# Patient Record
Sex: Male | Born: 1980 | Race: Black or African American | Hispanic: No | Marital: Single | Smoking: Heavy tobacco smoker
Health system: Southern US, Community
[De-identification: ages and names within clinical notes are randomized; demographics above are authoritative.]

---

## 2000-12-26 ENCOUNTER — Emergency Department (HOSPITAL_COMMUNITY): Admission: EM | Admit: 2000-12-26 | Discharge: 2000-12-26 | Payer: Self-pay | Admitting: Emergency Medicine

## 2005-12-12 ENCOUNTER — Emergency Department (HOSPITAL_COMMUNITY): Admission: EM | Admit: 2005-12-12 | Discharge: 2005-12-12 | Payer: Self-pay | Admitting: Emergency Medicine

## 2007-10-27 ENCOUNTER — Inpatient Hospital Stay (HOSPITAL_COMMUNITY): Admission: AC | Admit: 2007-10-27 | Discharge: 2007-10-29 | Payer: Self-pay

## 2007-10-31 ENCOUNTER — Emergency Department (HOSPITAL_COMMUNITY): Admission: EM | Admit: 2007-10-31 | Discharge: 2007-10-31 | Payer: Self-pay | Admitting: Emergency Medicine

## 2007-12-02 ENCOUNTER — Emergency Department (HOSPITAL_COMMUNITY): Admission: EM | Admit: 2007-12-02 | Discharge: 2007-12-02 | Payer: Self-pay | Admitting: Family Medicine

## 2009-01-25 ENCOUNTER — Emergency Department (HOSPITAL_COMMUNITY): Admission: EM | Admit: 2009-01-25 | Discharge: 2009-01-25 | Payer: Self-pay | Admitting: Emergency Medicine

## 2009-02-22 ENCOUNTER — Emergency Department (HOSPITAL_COMMUNITY): Admission: EM | Admit: 2009-02-22 | Discharge: 2009-02-22 | Payer: Self-pay | Admitting: Emergency Medicine
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
## 2010-04-03 ENCOUNTER — Encounter: Payer: Self-pay | Admitting: *Deleted
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
## 2012-05-14 ENCOUNTER — Emergency Department (HOSPITAL_COMMUNITY)
Admission: EM | Admit: 2012-05-14 | Discharge: 2012-05-14 | Disposition: A | Discharge status: Home / Self Care | Payer: No Typology Code available for payment source | Attending: Emergency Medicine | Admitting: Emergency Medicine

## 2012-05-14 ENCOUNTER — Encounter (HOSPITAL_COMMUNITY): Payer: Self-pay

## 2012-05-14 ENCOUNTER — Emergency Department (HOSPITAL_COMMUNITY): Payer: No Typology Code available for payment source

## 2012-05-14 DIAGNOSIS — Y9389 Activity, other specified: Secondary | ICD-10-CM | POA: Insufficient documentation

## 2012-05-14 DIAGNOSIS — S0993XA Unspecified injury of face, initial encounter: Secondary | ICD-10-CM | POA: Insufficient documentation

## 2012-05-14 DIAGNOSIS — T148XXA Other injury of unspecified body region, initial encounter: Secondary | ICD-10-CM

## 2012-05-14 DIAGNOSIS — S139XXA Sprain of joints and ligaments of unspecified parts of neck, initial encounter: Secondary | ICD-10-CM | POA: Insufficient documentation

## 2012-05-14 DIAGNOSIS — F17200 Nicotine dependence, unspecified, uncomplicated: Secondary | ICD-10-CM | POA: Insufficient documentation

## 2012-05-14 DIAGNOSIS — Y92410 Unspecified street and highway as the place of occurrence of the external cause: Secondary | ICD-10-CM | POA: Insufficient documentation

## 2012-05-14 DIAGNOSIS — S199XXA Unspecified injury of neck, initial encounter: Secondary | ICD-10-CM | POA: Insufficient documentation

## 2012-05-14 DIAGNOSIS — IMO0002 Reserved for concepts with insufficient information to code with codable children: Secondary | ICD-10-CM | POA: Insufficient documentation

## 2012-05-14 DIAGNOSIS — S8990XA Unspecified injury of unspecified lower leg, initial encounter: Secondary | ICD-10-CM | POA: Insufficient documentation

## 2012-05-14 MED ORDER — IBUPROFEN 400 MG PO TABS
800.0000 mg | ORAL_TABLET | Freq: Once | ORAL | Status: AC
  Administered 2012-05-14: 800 mg via ORAL
  Filled 2012-05-14: qty 2

## 2012-05-14 MED ORDER — CYCLOBENZAPRINE HCL 10 MG PO TABS: 10.0000 mg | ORAL_TABLET | Freq: Two times a day (BID) | ORAL | Status: DC | PRN

## 2012-05-14 MED ORDER — HYDROCODONE-ACETAMINOPHEN 5-325 MG PO TABS: 1.0000 | ORAL_TABLET | Freq: Four times a day (QID) | ORAL | Status: DC | PRN

## 2012-05-14 NOTE — ED Notes (Signed)
Patient transported to X-ray 
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
## 2012-05-15 NOTE — ED Provider Notes (Signed)
Medical screening examination/treatment/procedure(s) were performed by non-physician practitioner and as supervising physician I was immediately available for consultation/collaboration.   Lyanne Co, MD 05/15/12 [phone]

## 2015-07-10 ENCOUNTER — Emergency Department (HOSPITAL_COMMUNITY)
Admission: EM | Admit: 2015-07-10 | Discharge: 2015-07-10 | Disposition: A | Discharge status: Home / Self Care | Payer: No Typology Code available for payment source | Attending: Emergency Medicine | Admitting: Emergency Medicine

## 2015-07-10 ENCOUNTER — Encounter (HOSPITAL_COMMUNITY): Payer: Self-pay | Admitting: *Deleted

## 2015-07-10 DIAGNOSIS — F17210 Nicotine dependence, cigarettes, uncomplicated: Secondary | ICD-10-CM | POA: Insufficient documentation

## 2015-07-10 DIAGNOSIS — R55 Syncope and collapse: Secondary | ICD-10-CM | POA: Insufficient documentation

## 2015-07-10 NOTE — ED Notes (Signed)
Pt requesting work note and taxi called for pt
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
## 2015-07-10 NOTE — Discharge Instructions (Signed)

## 2016-06-22 ENCOUNTER — Emergency Department (HOSPITAL_COMMUNITY): Payer: Self-pay

## 2016-06-22 ENCOUNTER — Emergency Department (HOSPITAL_COMMUNITY)
Admission: EM | Admit: 2016-06-22 | Discharge: 2016-06-22 | Disposition: A | Discharge status: Home / Self Care | Payer: Self-pay | Attending: Emergency Medicine | Admitting: Emergency Medicine

## 2016-06-22 ENCOUNTER — Encounter (HOSPITAL_COMMUNITY): Payer: Self-pay | Admitting: Emergency Medicine

## 2016-06-22 DIAGNOSIS — S71101A Unspecified open wound, right thigh, initial encounter: Secondary | ICD-10-CM | POA: Insufficient documentation

## 2016-06-22 DIAGNOSIS — Y929 Unspecified place or not applicable: Secondary | ICD-10-CM | POA: Insufficient documentation

## 2016-06-22 DIAGNOSIS — Y999 Unspecified external cause status: Secondary | ICD-10-CM | POA: Insufficient documentation

## 2016-06-22 DIAGNOSIS — Y939 Activity, unspecified: Secondary | ICD-10-CM | POA: Insufficient documentation

## 2016-06-22 DIAGNOSIS — F17210 Nicotine dependence, cigarettes, uncomplicated: Secondary | ICD-10-CM | POA: Insufficient documentation

## 2016-06-22 DIAGNOSIS — W3400XA Accidental discharge from unspecified firearms or gun, initial encounter: Secondary | ICD-10-CM | POA: Insufficient documentation

## 2016-06-22 LAB — CBC WITH DIFFERENTIAL/PLATELET
BASOS ABS: 0 10*3/uL (ref 0.0–0.1)
Basophils Relative: 0 %
EOS ABS: 0.3 10*3/uL (ref 0.0–0.7)
EOS PCT: 4 %
HCT: 40.1 % (ref 39.0–52.0)
HEMOGLOBIN: 13.3 g/dL (ref 13.0–17.0)
LYMPHS PCT: 37 %
Lymphs Abs: 2.6 10*3/uL (ref 0.7–4.0)
MCH: 30.1 pg (ref 26.0–34.0)
MCHC: 33.2 g/dL (ref 30.0–36.0)
MCV: 90.7 fL (ref 78.0–100.0)
Monocytes Absolute: 0.5 10*3/uL (ref 0.1–1.0)
Monocytes Relative: 7 %
NEUTROS PCT: 52 %
Neutro Abs: 3.6 10*3/uL (ref 1.7–7.7)
PLATELETS: 284 10*3/uL (ref 150–400)
RBC: 4.42 MIL/uL (ref 4.22–5.81)
RDW: 14.2 % (ref 11.5–15.5)
WBC: 7.1 10*3/uL (ref 4.0–10.5)

## 2016-06-22 LAB — PREPARE FRESH FROZEN PLASMA
UNIT DIVISION: 0
Unit division: 0

## 2016-06-22 LAB — TYPE AND SCREEN
ABO/RH(D): B POS
ANTIBODY SCREEN: NEGATIVE
UNIT DIVISION: 0
Unit division: 0

## 2016-06-22 LAB — BPAM FFP
Blood Product Expiration Date: 201804162359
Blood Product Expiration Date: 201804162359
ISSUE DATE / TIME: 201804120059
ISSUE DATE / TIME: 201804120059
UNIT TYPE AND RH: 600
Unit Type and Rh: 6200

## 2016-06-22 LAB — BASIC METABOLIC PANEL
Anion gap: 7 (ref 5–15)
BUN: 6 mg/dL (ref 6–20)
CHLORIDE: 108 mmol/L (ref 101–111)
CO2: 25 mmol/L (ref 22–32)
Calcium: 9.2 mg/dL (ref 8.9–10.3)
Creatinine, Ser: 0.8 mg/dL (ref 0.61–1.24)
GFR calc non Af Amer: >60 mL/min (ref >60)
Glucose, Bld: 112 mg/dL — ABNORMAL HIGH (ref 65–99)
POTASSIUM: 3.4 mmol/L — AB (ref 3.5–5.1)
SODIUM: 140 mmol/L (ref 135–145)

## 2016-06-22 LAB — I-STAT CHEM 8, ED
BUN: 6 mg/dL (ref 6–20)
CHLORIDE: 107 mmol/L (ref 101–111)
Calcium, Ion: 1.11 mmol/L — ABNORMAL LOW (ref 1.15–1.40)
Creatinine, Ser: 1 mg/dL (ref 0.61–1.24)
Glucose, Bld: 111 mg/dL — ABNORMAL HIGH (ref 65–99)
HCT: 42 % (ref 39.0–52.0)
Hemoglobin: 14.3 g/dL (ref 13.0–17.0)
POTASSIUM: 3.4 mmol/L — AB (ref 3.5–5.1)
SODIUM: 142 mmol/L (ref 135–145)
TCO2: 25 mmol/L (ref 0–100)

## 2016-06-22 LAB — BPAM RBC
BLOOD PRODUCT EXPIRATION DATE: 201805072359
BLOOD PRODUCT EXPIRATION DATE: 201805112359
ISSUE DATE / TIME: 201804120058
ISSUE DATE / TIME: 201804120058
UNIT TYPE AND RH: 9500
Unit Type and Rh: 9500

## 2016-06-22 LAB — ABO/RH: ABO/RH(D): B POS

## 2016-06-22 MED ORDER — SODIUM CHLORIDE 0.9 % IV SOLN
INTRAVENOUS | Status: AC | PRN
  Administered 2016-06-22: 125 mL/h via INTRAVENOUS

## 2016-06-22 MED ORDER — HYDROCODONE-ACETAMINOPHEN 5-325 MG PO TABS: 1.0000 | ORAL_TABLET | ORAL | 0 refills | Status: DC | PRN

## 2016-06-22 MED ORDER — FENTANYL CITRATE (PF) 100 MCG/2ML IJ SOLN
INTRAMUSCULAR | Status: AC
  Filled 2016-06-22: qty 2

## 2016-06-22 MED ORDER — FENTANYL CITRATE (PF) 100 MCG/2ML IJ SOLN
50.0000 ug | Freq: Once | INTRAMUSCULAR | Status: AC
  Administered 2016-06-22: 50 ug via INTRAVENOUS

## 2016-06-22 MED ORDER — FENTANYL CITRATE (PF) 100 MCG/2ML IJ SOLN
INTRAMUSCULAR | Status: AC | PRN
  Administered 2016-06-22: 50 ug via INTRAVENOUS

## 2016-06-22 MED ORDER — IOPAMIDOL (ISOVUE-370) INJECTION 76%
100.0000 mL | Freq: Once | INTRAVENOUS | Status: AC | PRN
  Administered 2016-06-22: 100 mL via INTRAVENOUS

## 2016-06-22 MED ORDER — FENTANYL CITRATE (PF) 100 MCG/2ML IJ SOLN
50.0000 ug | Freq: Once | INTRAMUSCULAR | Status: AC
Start: 2016-06-22 — End: 2016-06-22
  Administered 2016-06-22: 50 ug via INTRAVENOUS

## 2016-06-22 MED ORDER — SODIUM CHLORIDE 0.9 % IV SOLN: INTRAVENOUS | Status: DC
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
## 2016-06-22 NOTE — ED Notes (Signed)
Transported to CT 
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
## 2018-06-11 ENCOUNTER — Other Ambulatory Visit: Payer: Self-pay

## 2018-06-11 ENCOUNTER — Encounter (HOSPITAL_COMMUNITY): Payer: Self-pay | Admitting: Emergency Medicine

## 2018-06-11 ENCOUNTER — Ambulatory Visit (HOSPITAL_COMMUNITY)
Admission: EM | Admit: 2018-06-11 | Discharge: 2018-06-11 | Disposition: A | Discharge status: Home / Self Care | Payer: Self-pay | Attending: Family Medicine | Admitting: Family Medicine

## 2018-06-11 DIAGNOSIS — H00034 Abscess of left upper eyelid: Secondary | ICD-10-CM

## 2018-06-11 MED ORDER — LIDOCAINE HCL (PF) 1 % IJ SOLN
INTRAMUSCULAR | Status: AC
  Filled 2018-06-11: qty 2

## 2018-06-11 MED ORDER — CEFTRIAXONE SODIUM 1 G IJ SOLR
INTRAMUSCULAR | Status: AC
Start: 2018-06-11 — End: ?
  Filled 2018-06-11: qty 10

## 2018-06-11 MED ORDER — AMOXICILLIN-POT CLAVULANATE 875-125 MG PO TABS: 1.0000 | ORAL_TABLET | Freq: Two times a day (BID) | ORAL | 0 refills | Status: DC

## 2018-06-11 MED ORDER — CEFTRIAXONE SODIUM 1 G IJ SOLR
1.0000 g | Freq: Once | INTRAMUSCULAR | Status: AC
  Administered 2018-06-11: 1 g via INTRAMUSCULAR

## 2018-06-11 NOTE — ED Triage Notes (Cosign Needed Addendum)
Over the past 3-4 days, left, upper eyelid swelling. Swelling to outer, left of eyelid.   redness and no pain present.  Denies vision changes.  No history of this type of issue prior to now.

## 2018-06-11 NOTE — ED Provider Notes (Signed)
[facility]    CSN: [id number] Arrival date & time: 06/11/18  1116     History   Chief Complaint Chief Complaint  Patient presents with  . Facial Swelling    HPI Matthew Bell is a 38 y.o. male.   Pt is a 38 year old male that presents with abscess to the left eyelid. This has been present and worsening over last 3 to 4 days.  He has not done anything to treat the symptoms.  It is to the upper eyelid near the outer canthus.  There is redness and tender to palpation.  No problems with vision.  Denies any drainage from the site.  Denies any fevers, chills, body aches, headache, dizziness.   ROS per HPI      History reviewed. No pertinent past medical history.  There are no active problems to display for this patient.   History reviewed. No pertinent surgical history.     Home Medications    Prior to Admission medications   Medication Sig Start Date End Date Taking? Authorizing Provider  amoxicillin-clavulanate (AUGMENTIN) 875-125 MG tablet Take 1 tablet by mouth every 12 (twelve) hours. 06/11/18   Janace Aris, NP    Family History Family History  Problem Relation Age of Onset  . Healthy Mother     Social History Social History   Tobacco Use  . Smoking status: Heavy Tobacco Smoker    Packs/day: 0.50    Types: Cigarettes  . Smokeless tobacco: Never Used  Substance Use Topics  . Alcohol use: Yes    Comment: socially, weekend  . Drug use: Yes    Types: Marijuana     Allergies   Patient has no known allergies.   Review of Systems Review of Systems   Physical Exam Triage Vital Signs ED Triage Vitals  Enc Vitals Group     BP 06/11/18 1139 (!) 142/87     Pulse Rate 06/11/18 1139 84     Resp 06/11/18 1139 18     Temp 06/11/18 1139 98.2 F (36.8 C)     Temp Source 06/11/18 1139 Oral     SpO2 06/11/18 1139 100 %     Weight --      Height --      Head Circumference --      Peak Flow --      Pain Score 06/11/18 1136 0     Pain  Loc --      Pain Edu? --      Excl. in GC? --    No data found.  Updated Vital Signs BP (!) 142/87   Pulse 84   Temp 98.2 F (36.8 C) (Oral)   Resp 18   SpO2 100%   Visual Acuity Right Eye Distance:   Left Eye Distance:   Bilateral Distance:    Right Eye Near:   Left Eye Near:    Bilateral Near:     Physical Exam Constitutional:      General: He is not in acute distress.    Appearance: He is not ill-appearing or toxic-appearing.  HENT:     Head: Normocephalic and atraumatic.     Nose: Nose normal.  Eyes:     Pupils: Pupils are equal, round, and reactive to light.     Comments: abscess to the left upper lid  Approximate 2 to 2-1/2 cm with fluctuance and surrounding induration Nontender to palpation of globe See picture for more detail  Neck:  Musculoskeletal: Normal range of motion.  Pulmonary:     Effort: Pulmonary effort is normal.  Musculoskeletal: Normal range of motion.  Skin:    General: Skin is warm and dry.  Neurological:     Mental Status: He is alert.  Psychiatric:        Mood and Affect: Mood normal.        UC Treatments / Results  Labs (all labs ordered are listed, but only abnormal results are displayed) Labs Reviewed - No data to display  EKG None  Radiology No results found.  Procedures Incision and Drainage Date/Time: 06/11/2018 2:20 PM Performed by: Janace Aris, NP Authorized by: Eustace Moore, MD   Consent:    Consent obtained:  Verbal   Consent given by:  Patient   Risks discussed:  Bleeding, incomplete drainage, pain and damage to other organs   Alternatives discussed:  No treatment Universal protocol:    Immediately prior to procedure a time out was called: yes     Patient identity confirmed:  Verbally with patient Location:    Type:  Abscess   Location:  Head   Head location:  L eyelid Pre-procedure details:    Skin preparation:  Betadine Anesthesia (see MAR for exact dosages):    Anesthesia method:   None Procedure type:    Complexity:  Complex Procedure details:    Needle aspiration: no     Incision types:  Single straight   Incision depth:  Subcutaneous   Scalpel blade:  11   Drainage:  Purulent   Drainage amount:  Moderate   Wound treatment:  Wound left open   Packing materials:  None Post-procedure details:    Patient tolerance of procedure:  Tolerated well, no immediate complications   (including critical care time)  Medications Ordered in UC Medications  cefTRIAXone (ROCEPHIN) injection 1 g (1 g Intramuscular Given 06/11/18 1251)    Initial Impression / Assessment and Plan / UC Course  I have reviewed the triage vital signs and the nursing notes.  Pertinent labs & imaging results that were available during my care of the patient were reviewed by me and considered in my medical decision making (see chart for details).     I&D of abscess with moderate amount of purulent, bloody drainage Patient tolerated well Gave injection of Rocephin here for antibiotic coverage and sent home with Augmentin to take twice a day for the next 7 days Strict precautions and instructions that if his symptoms continue or worsen despite treatment and the swelling or pain gets worse he would need to go to the ER Patient understands and agrees Final Clinical Impressions(s) / UC Diagnoses   Final diagnoses:  Abscess of left upper eyelid     Discharge Instructions     We lanced your abscess  I am giving you an injection of antibiotics here sending home with more antibiotics to take at home.  Strict instructions and precautions that if your symptoms worsen to include more severe eye swelling despite [place] treating you with antibiotics you need to go to the hospital    ED Prescriptions    Medication Sig Dispense Auth. Provider   amoxicillin-clavulanate (AUGMENTIN) 875-125 MG tablet Take 1 tablet by mouth every 12 (twelve) hours. 14 tablet Dahlia Byes A, NP     Controlled Substance  Prescriptions [place] Controlled Substance Registry consulted? Not Applicable   Janace Aris, NP 06/11/18 1422

## 2018-06-11 NOTE — Discharge Instructions (Signed)
We lanced your abscess  I am giving you an injection of antibiotics here sending home with more antibiotics to take at home.  Strict instructions and precautions that if your symptoms worsen to include more severe eye swelling despite [place] treating you with antibiotics you need to go to the hospital

## 2018-09-09 ENCOUNTER — Encounter (HOSPITAL_COMMUNITY): Payer: Self-pay | Admitting: Emergency Medicine

## 2018-09-09 ENCOUNTER — Ambulatory Visit (HOSPITAL_COMMUNITY)
Admission: EM | Admit: 2018-09-09 | Discharge: 2018-09-09 | Disposition: A | Discharge status: Home / Self Care | Payer: Self-pay | Attending: Family Medicine | Admitting: Family Medicine

## 2018-09-09 ENCOUNTER — Other Ambulatory Visit: Payer: Self-pay

## 2018-09-09 DIAGNOSIS — H00034 Abscess of left upper eyelid: Secondary | ICD-10-CM

## 2018-09-09 MED ORDER — AMOXICILLIN-POT CLAVULANATE 875-125 MG PO TABS: 1.0000 | ORAL_TABLET | Freq: Two times a day (BID) | ORAL | 0 refills | Status: DC

## 2018-09-09 NOTE — ED Triage Notes (Signed)
Pt here for small swollen area to left eye lid; pt sts hx of same in past

## 2018-09-09 NOTE — Discharge Instructions (Addendum)
Take Augmentin twice a day for seven days.  Return here or to ER if increased swelling, eye pain, or vision problems.

## 2018-09-09 NOTE — ED Provider Notes (Signed)
[facility]    CSN: [id number] Arrival date & time: 09/09/18  1334     History   Chief Complaint Chief Complaint  Patient presents with  . Facial Swelling    HPI Matthew Bell is a 38 y.o. male.   Patient presents today with left eyelid swelling x1 week.  He denies changes in vision or bony tenderness. No known injury.  He has a history of similar swelling in March 2020 which was an abscess; it was treated here with I&D and antibiotics.  It resolved and now has returned in the same location.  Patient denies fever, chills, visual change, nausea, vomiting, diarrhea, shortness of breath, chest pain.  The history is provided by the patient. No language interpreter was used.    History reviewed. No pertinent past medical history.  There are no active problems to display for this patient.   History reviewed. No pertinent surgical history.     Home Medications    Prior to Admission medications   Medication Sig Start Date End Date Taking? Authorizing Provider  amoxicillin-clavulanate (AUGMENTIN) 875-125 MG tablet Take 1 tablet by mouth every 12 (twelve) hours. 09/09/18   Mickie Bailate, Sigourney Portillo H, NP    Family History Family History  Problem Relation Age of Onset  . Healthy Mother     Social History Social History   Tobacco Use  . Smoking status: Heavy Tobacco Smoker    Packs/day: 0.50    Types: Cigarettes  . Smokeless tobacco: Never Used  Substance Use Topics  . Alcohol use: Yes    Comment: socially, weekend  . Drug use: Yes    Types: Marijuana     Allergies   Patient has no known allergies.   Review of Systems Review of Systems  Constitutional: Negative for chills and fever.  HENT: Negative for ear pain and sore throat.   Eyes: Negative for pain and visual disturbance.  Respiratory: Negative for cough and shortness of breath.   Cardiovascular: Negative for chest pain and palpitations.  Gastrointestinal: Negative for abdominal pain and vomiting.   Genitourinary: Negative for dysuria and hematuria.  Musculoskeletal: Negative for arthralgias and back pain.  Skin: Negative for color change and rash.  Neurological: Negative for seizures and syncope.  All other systems reviewed and are negative.    Physical Exam Triage Vital Signs ED Triage Vitals  Enc Vitals Group     BP 09/09/18 1416 114/74     Pulse Rate 09/09/18 1416 76     Resp 09/09/18 1416 16     Temp 09/09/18 1416 98 F (36.7 C)     Temp Source 09/09/18 1416 Oral     SpO2 09/09/18 1416 98 %     Weight --      Height --      Head Circumference --      Peak Flow --      Pain Score 09/09/18 1426 2     Pain Loc --      Pain Edu? --      Excl. in GC? --    No data found.  Updated Vital Signs BP 114/74 (BP Location: Left Arm)   Pulse 76   Temp 98 F (36.7 C) (Oral)   Resp 16   SpO2 98%   Visual Acuity Right Eye Distance:   Left Eye Distance:   Bilateral Distance:    Right Eye Near:   Left Eye Near:    Bilateral Near:     Physical Exam Vitals  signs and nursing note reviewed.  Constitutional:      Appearance: He is well-developed.  HENT:     Head: Normocephalic and atraumatic.  Eyes:     Conjunctiva/sclera: Conjunctivae normal.      Comments: Abscess on the left upper lid: approximately 1 cm with fluctuance and surrounding induration. Nontender to palpation of globe. See picture for more detail.   Neck:     Musculoskeletal: Neck supple.  Cardiovascular:     Rate and Rhythm: Normal rate and regular rhythm.     Heart sounds: No murmur.  Pulmonary:     Effort: Pulmonary effort is normal. No respiratory distress.     Breath sounds: Normal breath sounds.  Abdominal:     Palpations: Abdomen is soft.     Tenderness: There is no abdominal tenderness.  Skin:    General: Skin is warm and dry.  Neurological:     General: No focal deficit present.     Mental Status: He is alert and oriented to person, place, and time.        UC Treatments /  Results  Labs (all labs ordered are listed, but only abnormal results are displayed) Labs Reviewed - No data to display  EKG None  Radiology No results found.  Procedures Incision and Drainage  Date/Time: 09/09/2018 3:14 PM Performed by: Sharion Balloon, NP Authorized by: Raylene Everts, MD   Consent:    Consent obtained:  Verbal   Consent given by:  Patient Location:    Type:  Abscess   Size:  1 cm Pre-procedure details:    Skin preparation:  Betadine Anesthesia (see MAR for exact dosages):    Anesthesia method:  Topical application Procedure type:    Complexity:  Simple Procedure details:    Incision types:  Single straight   Scalpel blade:  11   Drainage:  Purulent   Drainage amount:  Moderate   Packing materials:  None Post-procedure details:    Patient tolerance of procedure:  Tolerated well, no immediate complications   (including critical care time)  Medications Ordered in UC Medications - No data to display  Initial Impression / Assessment and Plan / UC Course  I have reviewed the triage vital signs and the nursing notes.  Pertinent labs & imaging results that were available during my care of the patient were reviewed by me and considered in my medical decision making (see chart for details).   Left eyelid abscess.  I&D with moderate purulent drainage.  Augmentin x7 days.  Discussed need to return here or to emergency department if increased swelling, eye pain, vision change, fever, chills, or other concerning symptoms.  Otherwise follow-up with primary care provider in 1 to 2 weeks.  Final Clinical Impressions(s) / UC Diagnoses   Final diagnoses:  Abscess of left upper eyelid     Discharge Instructions     Take Augmentin twice a day for seven days.  Return here or to ER if increased swelling, eye pain, or vision problems.       ED Prescriptions    Medication Sig Dispense Auth. Provider   amoxicillin-clavulanate (AUGMENTIN) 875-125 MG  tablet Take 1 tablet by mouth every 12 (twelve) hours. 14 tablet Sharion Balloon, NP     Controlled Substance Prescriptions  Controlled Substance Registry consulted? Not Applicable   Sharion Balloon, NP 09/09/18 1517

## 2021-10-31 ENCOUNTER — Ambulatory Visit (HOSPITAL_COMMUNITY)
Admission: EM | Admit: 2021-10-31 | Discharge: 2021-10-31 | Disposition: A | Discharge status: Home / Self Care | Payer: Self-pay | Attending: Family Medicine | Admitting: Family Medicine

## 2021-10-31 ENCOUNTER — Encounter (HOSPITAL_COMMUNITY): Payer: Self-pay | Admitting: *Deleted

## 2021-10-31 DIAGNOSIS — F17210 Nicotine dependence, cigarettes, uncomplicated: Secondary | ICD-10-CM | POA: Insufficient documentation

## 2021-10-31 DIAGNOSIS — B349 Viral infection, unspecified: Secondary | ICD-10-CM

## 2021-10-31 DIAGNOSIS — R519 Headache, unspecified: Secondary | ICD-10-CM | POA: Insufficient documentation

## 2021-10-31 DIAGNOSIS — Z20822 Contact with and (suspected) exposure to covid-19: Secondary | ICD-10-CM | POA: Insufficient documentation

## 2021-10-31 DIAGNOSIS — M549 Dorsalgia, unspecified: Secondary | ICD-10-CM | POA: Insufficient documentation

## 2021-10-31 LAB — RESP PANEL BY RT-PCR (FLU A&B, COVID) ARPGX2
Influenza A by PCR: NEGATIVE
Influenza B by PCR: NEGATIVE
SARS Coronavirus 2 by RT PCR: NEGATIVE

## 2021-10-31 MED ORDER — ACETAMINOPHEN 325 MG PO TABS
ORAL_TABLET | ORAL | Status: AC
  Filled 2021-10-31: qty 2

## 2021-10-31 MED ORDER — ACETAMINOPHEN 325 MG PO TABS
650.0000 mg | ORAL_TABLET | Freq: Once | ORAL | Status: AC
  Administered 2021-10-31: 650 mg via ORAL

## 2021-10-31 NOTE — ED Triage Notes (Signed)
Patient states that he has been having headaches, backache and fever since yesterday. He has taken alka seltzer without any relief.

## 2021-10-31 NOTE — ED Provider Notes (Addendum)
[facility]    CSN: [id number] Arrival date & time: 10/31/21  4098      History   Chief Complaint Chief Complaint  Patient presents with   Back Pain   Fever   Headache    HPI Matthew Bell is a 41 y.o. male.    Back Pain Associated symptoms: fever and headaches   Fever Associated symptoms: headaches   Headache Associated symptoms: back pain and fever    Here for a 1 day history of body aches and subjective fever and chills.  He has not had any nausea, vomiting, or diarrhea.  He has not had any cough or congestion.  He also has had a headache  He does smoke tobacco  History reviewed. No pertinent past medical history.  There are no problems to display for this patient.   History reviewed. No pertinent surgical history.     Home Medications    Prior to Admission medications   Not on File    Family History Family History  Problem Relation Age of Onset   Healthy Mother     Social History Social History   Tobacco Use   Smoking status: Heavy Smoker    Packs/day: 0.50    Types: Cigarettes   Smokeless tobacco: Never  Vaping Use   Vaping Use: Never used  Substance Use Topics   Alcohol use: Yes    Comment: socially, weekend   Drug use: Yes    Types: Marijuana     Allergies   Patient has no known allergies.   Review of Systems Review of Systems  Constitutional:  Positive for fever.  Musculoskeletal:  Positive for back pain.  Neurological:  Positive for headaches.     Physical Exam Triage Vital Signs ED Triage Vitals  Enc Vitals Group     BP 10/31/21 0924 (!) 150/100     Pulse Rate 10/31/21 0924 73     Resp 10/31/21 0924 18     Temp 10/31/21 0924 98.2 F (36.8 C)     Temp Source 10/31/21 0924 Oral     SpO2 10/31/21 0924 98 %     Weight --      Height --      Head Circumference --      Peak Flow --      Pain Score 10/31/21 0923 8     Pain Loc --      Pain Edu? --      Excl. in GC? --    No data found.  Updated  Vital Signs BP (!) 150/100 (BP Location: Left Arm)   Pulse 73   Temp 98.2 F (36.8 C) (Oral)   Resp 18   SpO2 98%   Visual Acuity Right Eye Distance:   Left Eye Distance:   Bilateral Distance:    Right Eye Near:   Left Eye Near:    Bilateral Near:     Physical Exam Vitals reviewed.  Constitutional:      General: He is not in acute distress.    Appearance: He is not toxic-appearing.  HENT:     Right Ear: Tympanic membrane and ear canal normal.     Left Ear: Tympanic membrane and ear canal normal.     Nose: Nose normal.     Mouth/Throat:     Mouth: Mucous membranes are moist.     Comments: There is some mild erythema of the posterior oropharynx and of the tonsillar pillars and soft palate.  No asymmetry and  no ulcerations. Eyes:     Extraocular Movements: Extraocular movements intact.     Conjunctiva/sclera: Conjunctivae normal.     Pupils: Pupils are equal, round, and reactive to light.  Cardiovascular:     Rate and Rhythm: Normal rate and regular rhythm.     Heart sounds: No murmur heard. Pulmonary:     Effort: Pulmonary effort is normal. No respiratory distress.     Breath sounds: No stridor. No wheezing, rhonchi or rales.  Musculoskeletal:     Cervical back: Neck supple.  Lymphadenopathy:     Cervical: No cervical adenopathy.  Skin:    Capillary Refill: Capillary refill takes less than 2 seconds.     Coloration: Skin is not jaundiced or pale.  Neurological:     General: No focal deficit present.     Mental Status: He is alert and oriented to person, place, and time.  Psychiatric:        Behavior: Behavior normal.      UC Treatments / Results  Labs (all labs ordered are listed, but only abnormal results are displayed) Labs Reviewed  RESP PANEL BY RT-PCR (FLU A&B, COVID) [id number]    EKG   Radiology No results found.  Procedures Procedures (including critical care time)  Medications Ordered in UC Medications  acetaminophen (TYLENOL) tablet 650 mg  (has no administration in time range)    Initial Impression / Assessment and Plan / UC Course  I have reviewed the triage vital signs and the nursing notes.  Pertinent labs & imaging results that were available during my care of the patient were reviewed by me and considered in my medical decision making (see chart for details).     COVID and flu swab combination is done today.  If he is positive for flu, he would benefit from a Tamiflu prescription.  If he is positive for COVID he would benefit from [facility] prescription as he is a smoker.  Last creatinine and GFR were normal and GFR was over 60 Final Clinical Impressions(s) / UC Diagnoses   Final diagnoses:  Viral illness     Discharge Instructions      You have been given a dose of Tylenol 650 mg for pain and fever.   You have been swabbed for COVID and for flu, and the test will result in the next few hours. Our staff will call you if positive. If the covid test is positive, you should quarantine for 5 days from the start of your symptoms      ED Prescriptions   None    PDMP not reviewed this encounter.   Zenia Resides, MD 10/31/21 [phone]    Zenia Resides, MD 10/31/21 [id number]    Zenia Resides, MD 10/31/21 [phone]

## 2021-10-31 NOTE — Discharge Instructions (Addendum)
You have been given a dose of Tylenol 650 mg for pain and fever.   You have been swabbed for COVID and for flu, and the test will result in the next few hours. Our staff will call you if positive. If the covid test is positive, you should quarantine for 5 days from the start of your symptoms
